# Patient Record
Sex: Male | Born: 1945 | Race: Black or African American | Hispanic: No | Marital: Single
Health system: Southern US, Community
[De-identification: ages and names within clinical notes are randomized; demographics above are authoritative.]

---

## 2011-01-13 ENCOUNTER — Inpatient Hospital Stay: Payer: Self-pay | Admitting: Internal Medicine

## 2012-12-22 ENCOUNTER — Emergency Department: Payer: Self-pay | Admitting: Emergency Medicine

## 2012-12-22 LAB — BASIC METABOLIC PANEL
Anion Gap: 11 (ref 7–16)
BUN: 14 mg/dL (ref 7–18)
Calcium, Total: 6.6 mg/dL — CL (ref 8.5–10.1)
Chloride: 110 mmol/L — ABNORMAL HIGH (ref 98–107)
Co2: 23 mmol/L (ref 21–32)
Creatinine: 1.66 mg/dL — ABNORMAL HIGH (ref 0.60–1.30)
EGFR (Non-African Amer.): 42 — ABNORMAL LOW
Glucose: 106 mg/dL — ABNORMAL HIGH (ref 65–99)
Osmolality: 288 (ref 275–301)

## 2012-12-22 LAB — TROPONIN I: Troponin-I: 0.02 ng/mL

## 2012-12-22 LAB — CBC
HGB: 12.9 g/dL — ABNORMAL LOW (ref 13.0–18.0)
MCHC: 34 g/dL (ref 32.0–36.0)
RDW: 14.5 % (ref 11.5–14.5)
WBC: 6.4 10*3/uL (ref 3.8–10.6)

## 2012-12-24 ENCOUNTER — Emergency Department: Payer: Self-pay | Admitting: Emergency Medicine

## 2012-12-24 LAB — COMPREHENSIVE METABOLIC PANEL
Albumin: 3.6 g/dL (ref 3.4–5.0)
Alkaline Phosphatase: 251 U/L — ABNORMAL HIGH (ref 50–136)
Anion Gap: 8 (ref 7–16)
Bilirubin,Total: 0.6 mg/dL (ref 0.2–1.0)
Calcium, Total: 6.5 mg/dL — CL (ref 8.5–10.1)
Chloride: 107 mmol/L (ref 98–107)
Creatinine: 1.7 mg/dL — ABNORMAL HIGH (ref 0.60–1.30)
EGFR (African American): 48 — ABNORMAL LOW
Glucose: 137 mg/dL — ABNORMAL HIGH (ref 65–99)
Osmolality: 281 (ref 275–301)
Potassium: 3.1 mmol/L — ABNORMAL LOW (ref 3.5–5.1)
SGOT(AST): 40 U/L — ABNORMAL HIGH (ref 15–37)
SGPT (ALT): 28 U/L (ref 12–78)
Total Protein: 7.4 g/dL (ref 6.4–8.2)

## 2012-12-24 LAB — CBC
MCH: 29.8 pg (ref 26.0–34.0)
Platelet: 221 10*3/uL (ref 150–440)
RBC: 4.68 10*6/uL (ref 4.40–5.90)
RDW: 14.6 % — ABNORMAL HIGH (ref 11.5–14.5)
WBC: 6.8 10*3/uL (ref 3.8–10.6)

## 2013-01-08 ENCOUNTER — Inpatient Hospital Stay: Payer: Self-pay | Admitting: Internal Medicine

## 2013-01-08 LAB — COMPREHENSIVE METABOLIC PANEL
Alkaline Phosphatase: 197 U/L — ABNORMAL HIGH (ref 50–136)
Anion Gap: 7 (ref 7–16)
Calcium, Total: 6.7 mg/dL — CL (ref 8.5–10.1)
Chloride: 109 mmol/L — ABNORMAL HIGH (ref 98–107)
Co2: 24 mmol/L (ref 21–32)
Creatinine: 1.19 mg/dL (ref 0.60–1.30)
EGFR (Non-African Amer.): >60
Potassium: 3.1 mmol/L — ABNORMAL LOW (ref 3.5–5.1)
SGOT(AST): 39 U/L — ABNORMAL HIGH (ref 15–37)
SGPT (ALT): 20 U/L (ref 12–78)
Sodium: 140 mmol/L (ref 136–145)

## 2013-01-08 LAB — CBC
HCT: 33.4 % — ABNORMAL LOW (ref 40.0–52.0)
HGB: 11.4 g/dL — ABNORMAL LOW (ref 13.0–18.0)
MCH: 30.2 pg (ref 26.0–34.0)
MCHC: 34.1 g/dL (ref 32.0–36.0)
MCV: 88 fL (ref 80–100)
Platelet: 242 x10 3/mm 3 (ref 150–440)
RBC: 3.77 x10 6/mm 3 — ABNORMAL LOW (ref 4.40–5.90)
RDW: 14.3 % (ref 11.5–14.5)
WBC: 7.3 x10 3/mm 3 (ref 3.8–10.6)

## 2013-01-08 LAB — URINALYSIS, COMPLETE
Bacteria: NONE SEEN
Bilirubin,UR: NEGATIVE
Blood: NEGATIVE
Glucose,UR: NEGATIVE mg/dL (ref 0–75)
Ketone: NEGATIVE
Leukocyte Esterase: NEGATIVE
Nitrite: NEGATIVE
Ph: 6 (ref 4.5–8.0)
Protein: 30
RBC,UR: <1 /HPF (ref 0–5)
Specific Gravity: 1.016 (ref 1.003–1.030)
Squamous Epithelial: 1
WBC UR: 1 /HPF (ref 0–5)

## 2013-01-09 LAB — BASIC METABOLIC PANEL
Anion Gap: 6 — ABNORMAL LOW (ref 7–16)
BUN: 11 mg/dL (ref 7–18)
Calcium, Total: 7.3 mg/dL — ABNORMAL LOW (ref 8.5–10.1)
Chloride: 113 mmol/L — ABNORMAL HIGH (ref 98–107)
Co2: 23 mmol/L (ref 21–32)
Creatinine: 0.9 mg/dL (ref 0.60–1.30)
EGFR (African American): >60
EGFR (Non-African Amer.): >60
Glucose: 83 mg/dL (ref 65–99)
Osmolality: 282 (ref 275–301)
Potassium: 3.5 mmol/L (ref 3.5–5.1)
Sodium: 142 mmol/L (ref 136–145)

## 2013-01-09 LAB — MAGNESIUM: Magnesium: 1.6 mg/dL — ABNORMAL LOW

## 2013-05-13 ENCOUNTER — Emergency Department: Payer: Self-pay | Admitting: Emergency Medicine

## 2013-05-13 LAB — COMPREHENSIVE METABOLIC PANEL
ALT: 16 U/L (ref 12–78)
AST: 33 U/L (ref 15–37)
Albumin: 3.5 g/dL (ref 3.4–5.0)
Alkaline Phosphatase: 202 U/L — ABNORMAL HIGH
Anion Gap: 6 — ABNORMAL LOW (ref 7–16)
BUN: 9 mg/dL (ref 7–18)
Bilirubin,Total: 0.6 mg/dL (ref 0.2–1.0)
CO2: 31 mmol/L (ref 21–32)
Calcium, Total: 5.9 mg/dL — CL (ref 8.5–10.1)
Chloride: 104 mmol/L (ref 98–107)
Creatinine: 1.34 mg/dL — ABNORMAL HIGH (ref 0.60–1.30)
EGFR (African American): >60
GFR CALC NON AF AMER: 54 — AB
GLUCOSE: 123 mg/dL — AB (ref 65–99)
Osmolality: 281 (ref 275–301)
Potassium: 3 mmol/L — ABNORMAL LOW (ref 3.5–5.1)
Sodium: 141 mmol/L (ref 136–145)
Total Protein: 7.7 g/dL (ref 6.4–8.2)

## 2013-05-13 LAB — ETHANOL: Ethanol %: <0.003 % (ref 0.000–0.080)

## 2013-05-13 LAB — URINALYSIS, COMPLETE
Bilirubin,UR: NEGATIVE
Glucose,UR: NEGATIVE mg/dL (ref 0–75)
Hyaline Cast: 1
Nitrite: NEGATIVE
Ph: 6 (ref 4.5–8.0)
Protein: 100
RBC,UR: 1 /HPF (ref 0–5)
Specific Gravity: 1.014 (ref 1.003–1.030)
Squamous Epithelial: 3

## 2013-05-13 LAB — CBC
HCT: 40.3 % (ref 40.0–52.0)
HGB: 12.9 g/dL — ABNORMAL LOW (ref 13.0–18.0)
MCH: 28.1 pg (ref 26.0–34.0)
MCHC: 32 g/dL (ref 32.0–36.0)
MCV: 88 fL (ref 80–100)
Platelet: 219 10*3/uL (ref 150–440)
RBC: 4.59 10*6/uL (ref 4.40–5.90)
RDW: 14 % (ref 11.5–14.5)
WBC: 8.1 10*3/uL (ref 3.8–10.6)

## 2013-05-13 LAB — DRUG SCREEN, URINE

## 2013-05-13 LAB — LIPASE, BLOOD: LIPASE: 73 U/L (ref 73–393)

## 2013-05-13 LAB — TROPONIN I: Troponin-I: <0.02 ng/mL

## 2013-05-13 LAB — T4, FREE: Free Thyroxine: 1.22 ng/dL (ref 0.76–1.46)

## 2013-05-13 LAB — TSH: Thyroid Stimulating Horm: 0.16 u[IU]/mL — ABNORMAL LOW

## 2013-09-06 ENCOUNTER — Emergency Department: Payer: Self-pay | Admitting: Emergency Medicine

## 2014-05-24 ENCOUNTER — Emergency Department: Payer: Self-pay | Admitting: Emergency Medicine

## 2014-07-11 NOTE — Consult Note (Signed)
PATIENT NAME:  Christopher AlbertsMCBROOM, Russel MR#:  [id number] DATE OF BIRTH:  10/22/1945  DATE OF CONSULTATION:  01/09/2013  REFERRING PHYSICIAN:   CONSULTING PHYSICIAN:  Leitha SchullerMichael J. Emitt Maglione, MD  REASON FOR CONSULT: Right knee pain.   HISTORY OF PRESENT ILLNESS: The patient is a 69 year old male who denies history of chronic knee problems. He, approximately a week prior to admission, developed swelling to his knee without history of injury. He is able to walk but has some pain. He was to have an MRI but was unable to have this. I saw him for evaluation. On exam, he has an effusion to the right knee. He is able to maintain the knee in extension against gravity. He is able to flex the knee to 95 degrees with mild pain. He has diffuse pain about the knee with a Baker's cyst present. He has negative medial and lateral McMurray. No instability to varus, valgus, posterior drawer or Lachman.   X-rays were obtained that show chondrocalcinosis with effusion. There is stippled calcification of the lateral meniscus. This is nonweightbearing film, and joint space is difficult to assess.   CLINICAL IMPRESSION: Chondrocalcinosis, right knee.   RECOMMENDATION: Follow up as an outpatient. I will get weightbearing films at that time and probably aspirate and inject the knee on his return.   ____________________________ Leitha SchullerMichael J. Valjean Ruppel, MD mjm:gb D: 01/10/2013 05:11:12 ET T: 01/10/2013 05:30:46 ET JOB#: [id number]  cc: Leitha SchullerMichael J. Jaycee Mckellips, MD, <Dictator> Leitha SchullerMICHAEL J Mckinsey Keagle MD ELECTRONICALLY SIGNED 01/10/2013 7:29
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
## 2014-07-11 NOTE — Consult Note (Signed)
Brief Consult Note: Diagnosis: right knee pain and swelling.   Patient was seen by consultant.   Orders entered.   Comments: xray ordered to rule out fracture, if negative, follow up next week in office.  Electronic Signatures: Leitha SchullerMenz, Yonna Alwin J (MD)  (Signed 22-Oct-14 12:19)  Authored: Brief Consult Note   Last Updated: 22-Oct-14 12:19 by Leitha SchullerMenz, Benjamim Harnish J (MD)

## 2014-07-11 NOTE — Discharge Summary (Signed)
PATIENT NAME:  Christopher Cooke, Christopher Cooke MR#:  [id number] DATE OF BIRTH:  [birth year]  DATE OF ADMISSION:  01/08/2013 DATE OF DISCHARGE:  01/09/2013  ADMISSION DIAGNOSIS: Hypomagnesemia.   DISCHARGE DIAGNOSES: 1.  Hypomagnesemia secondary to short gut syndrome.  2.  Right knee pain.  3.  History of chronic diarrhea secondary to short gut syndrome.   CONSULTATIONS: Dr. Rosita KeaMenz.   PERTINENT LABORATORY AND RADIOLOGICAL DATA: Right knee x-ray showed no acute fracture. Sodium 142, potassium 3.5, chloride 113, bicarbonate 23, BUN 11, creatinine 0.9, glucose 83, magnesium 1.6 which was repleted.   HOSPITAL COURSE: This is a 69 year old male with short gut syndrome after an ileal and colon resection. He was on magnesium replacement, but had to stop his magnesium replacement for several months, who presented with paresthesias of his lower extremities and inability to walk. For further details, please refer to the H and P.  1.  Hypomagnesemia, likely etiology of his paresthesias, which has improved after magnesium replacement. I did discuss with him the importance of taking his magnesium supplementation.  2.  Right knee pain: This could possibly be a meniscus or ligament tear or sprain. His knee x-ray was negative for fracxture. Dr. Rosita KeaMenz was consulted. PT was also consulted. They recommend home with home health care. He is doing better. He will need some pain medications at discharge but will continue with supportive care for now.  3.  Short gut syndrome: The patient has chronic diarrhea from this and as mentioned will need to continue his magnesium supplements.   DISCHARGE MEDICATIONS: 1.  Multivitamin daily.  2.  Vitamin B12 daily, 1000 mcg.  3.  Colestipol 1 gram b.i.d.  4.  Magnesium oxide 400 mg b.i.d.  5.  Tylenol No. 3 q.6 hours p.r.n. for pain.  DISCHARGE HOME HEALTH: With PT for gait training, right knee pain, swelling.   DISCHARGE DIET: Regular.   DISCHARGE ACTIVITY: As tolerated.   DISCHARGE  FOLLOWUP: The patient will follow up in 1 to 2 weeks with Dr. Rosita Ke[facility]Menz [facility] 1 week.   TIME SPENT: 35 minutes. The patient is medically stable for discharge.   ____________________________ Tavarious Freel P. Juliene PinaMody, MD spm:jm D: 01/09/2013 14:04:25 ET T: 01/09/2013 18:33:03 ET JOB#: [id number]  cc: Lonzell Dorris P. Juliene PinaMody, MD, <Dictator> [facility] [name] ELECTRONICALLY SIGNED 01/10/2013 11:37
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
